# Patient Record
Sex: Male | Born: 1985 | Race: White | Hispanic: No | Marital: Single | Smoking: Never smoker
Health system: Southern US, Community
[De-identification: ages and names within clinical notes are randomized; demographics above are authoritative.]

## PROBLEM LIST (undated history)

## (undated) DIAGNOSIS — F909 Attention-deficit hyperactivity disorder, unspecified type: Secondary | ICD-10-CM

## (undated) HISTORY — DX: Attention-deficit hyperactivity disorder, unspecified type: F90.9

---

## 2007-03-26 ENCOUNTER — Ambulatory Visit (HOSPITAL_COMMUNITY): Admission: RE | Admit: 2007-03-26 | Discharge: 2007-03-26 | Payer: Self-pay | Admitting: Chiropractic Medicine

## 2007-05-03 ENCOUNTER — Ambulatory Visit: Payer: Self-pay | Admitting: Family Medicine

## 2008-04-08 ENCOUNTER — Emergency Department (HOSPITAL_COMMUNITY): Admission: EM | Admit: 2008-04-08 | Discharge: 2008-04-08 | Payer: Self-pay | Admitting: Emergency Medicine

## 2008-04-10 ENCOUNTER — Ambulatory Visit: Payer: Self-pay | Admitting: Family Medicine

## 2008-05-25 ENCOUNTER — Ambulatory Visit: Payer: Self-pay | Admitting: Family Medicine

## 2009-03-25 IMAGING — CR DG KNEE COMPLETE 4+V*R*
4 series · 4 of 4 positions shown · non-contrast
Comparison: 03/26/2007.

Addendum Begins

There is a small cortical avulsion fracture in the anterior aspect
of the of one of the femoral condyles on the lateral view.
Addendum Ends
CLINICAL DATA: Anterior right knee pain and swelling following an
injury.
RIGHT KNEE - COMPLETE 4+ VIEW

[t knee lat right]
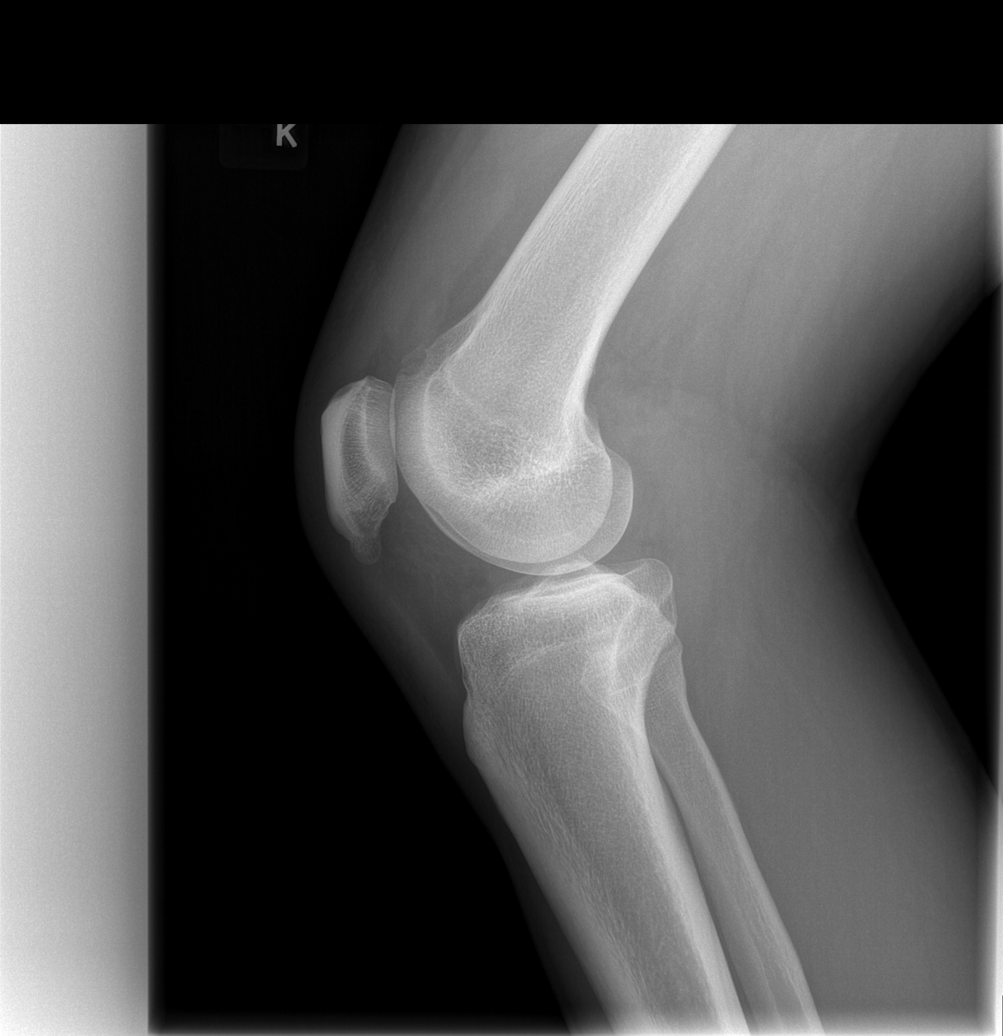

[t knee ap right]
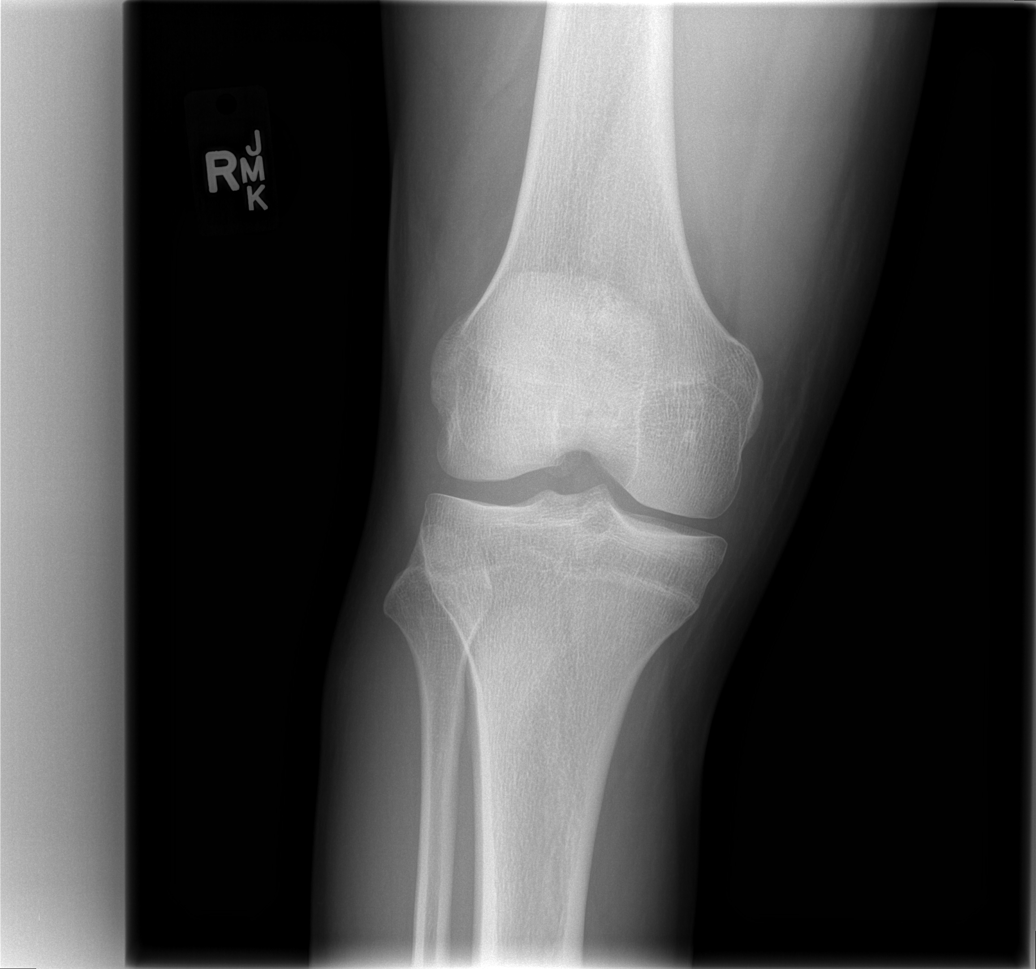

[t knee oblique right (1 of 2)]
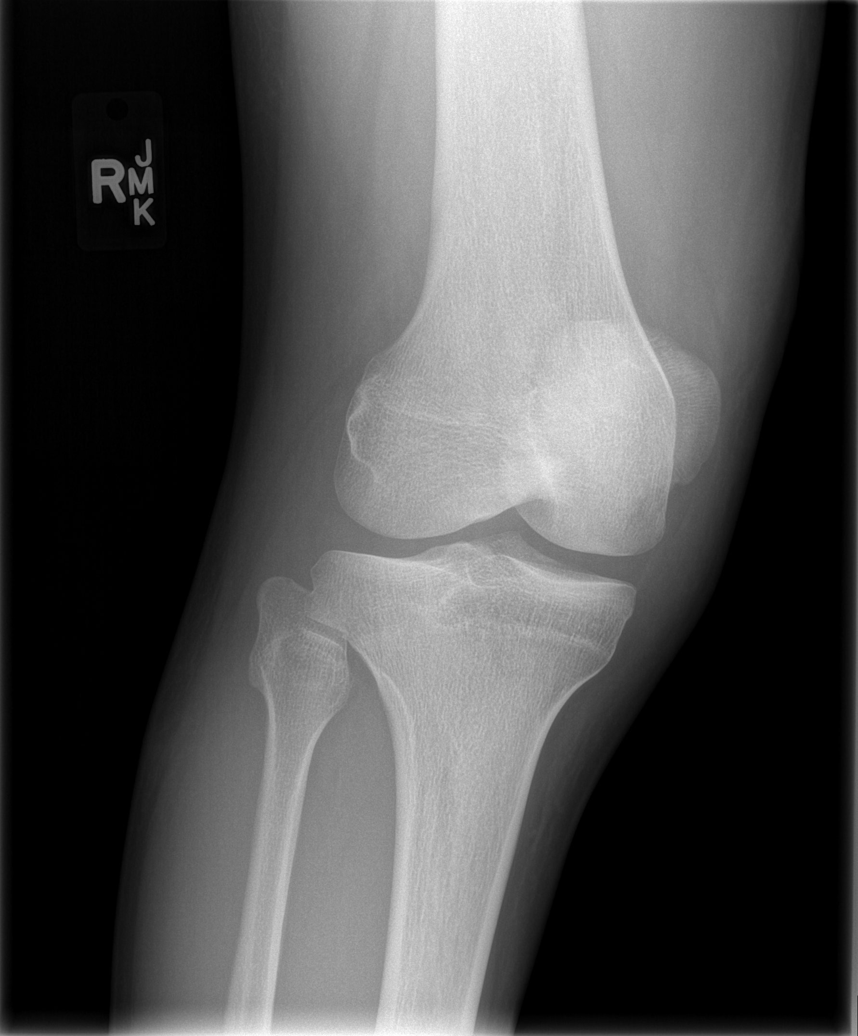

[t knee oblique right (2 of 2)]
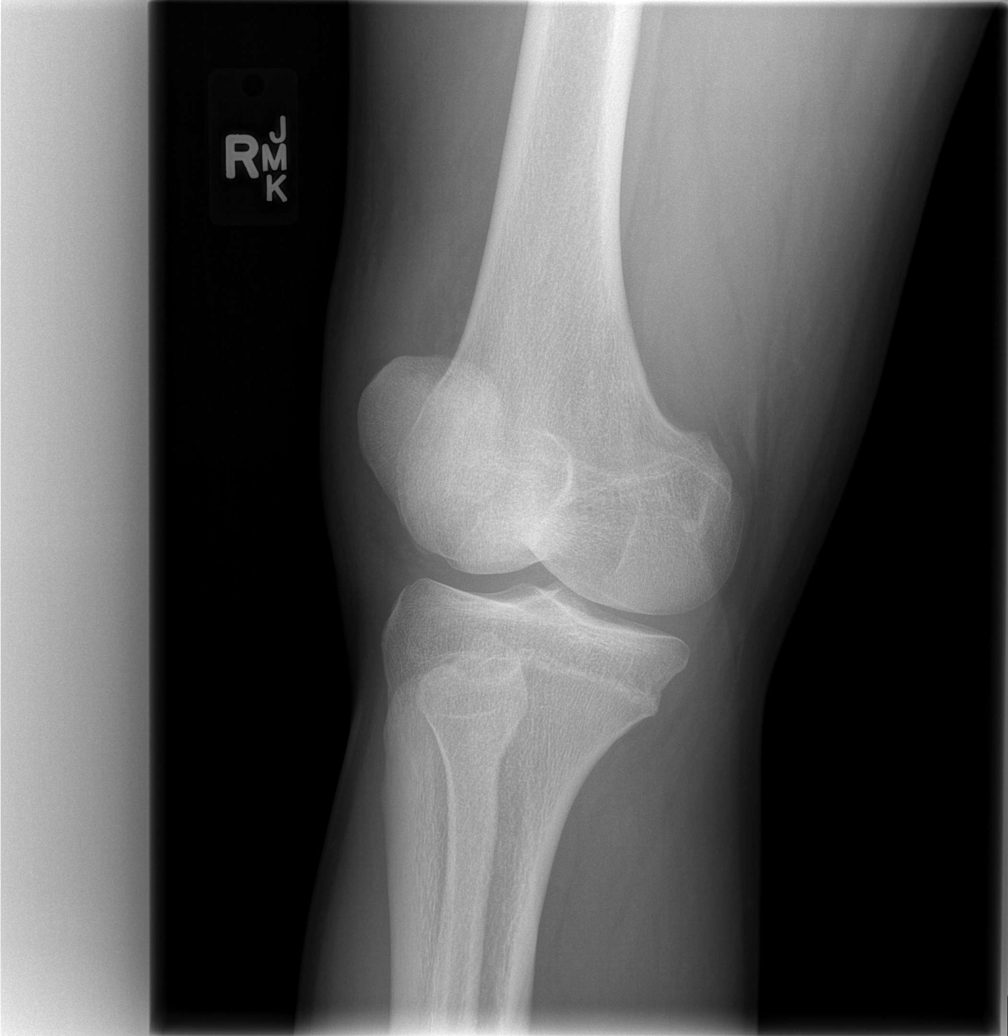

[4 of 4 positions shown; findings below may reference images not displayed]

FINDINGS: Interval small moderate sized effusion.  Stable
elongation of the inferior patella.  No fracture or dislocation
seen.
IMPRESSION: Small to moderate sized effusion.  No fracture.

## 2009-08-15 DEATH — deceased

## 2010-05-17 ENCOUNTER — Ambulatory Visit: Payer: Self-pay | Admitting: Family Medicine

## 2010-05-21 ENCOUNTER — Ambulatory Visit: Payer: Self-pay | Admitting: Family Medicine

## 2011-06-04 ENCOUNTER — Encounter: Payer: Self-pay | Admitting: Family Medicine

## 2011-07-23 ENCOUNTER — Ambulatory Visit (INDEPENDENT_AMBULATORY_CARE_PROVIDER_SITE_OTHER): Payer: BC Managed Care – PPO | Admitting: Family Medicine

## 2011-07-23 ENCOUNTER — Encounter: Payer: Self-pay | Admitting: Family Medicine

## 2011-07-23 VITALS — BP 110/76 | HR 80 | Temp 97.7°F | Wt 186.0 lb

## 2011-07-23 DIAGNOSIS — J209 Acute bronchitis, unspecified: Secondary | ICD-10-CM

## 2011-07-23 MED ORDER — AMOXICILLIN ER 775 MG PO TB24: 775.0000 mg | ORAL_TABLET | Freq: Every day | ORAL | Status: AC

## 2011-07-23 NOTE — Patient Instructions (Addendum)
Take whatever you need for the cough but you might find NyQuil to work really well at night. Call me if you're not totally back to normal at the end of the antibiotics

## 2011-07-23 NOTE — Progress Notes (Signed)
  Subjective:    Patient ID: Arthur Thomas, male    DOB: 20-Mar-1986, 25 y.o.   MRN: [id number]  HPI [name] 2 weeks ago he started with nasal congestion, runny nose, sore throat followed by productive cough several days later no fevers, chills, earache. The sinus congestion is much better however he is still having a productive cough the he does not smoke and does not have spinning and fall allergies   Review of Systems     Objective:   Physical Exam alert and in no distress. Tympanic membranes and canals are normal. Throat is clear. Tonsils are normal. Neck is supple without adenopathy or thyromegaly. Cardiac exam shows a regular sinus rhythm without murmurs or gallops. Lungs are clear to auscultation.        Assessment & Plan:   1. Bronchitis, acute    I will treat with Moxatag. He is to call me if not entirely better.

## 2011-07-24 ENCOUNTER — Ambulatory Visit: Payer: Self-pay | Admitting: Family Medicine

## 2011-11-05 ENCOUNTER — Encounter: Payer: Self-pay | Admitting: Medical

## 2011-11-05 ENCOUNTER — Ambulatory Visit (INDEPENDENT_AMBULATORY_CARE_PROVIDER_SITE_OTHER): Payer: BC Managed Care – PPO | Admitting: Medical

## 2011-11-05 ENCOUNTER — Other Ambulatory Visit: Payer: Self-pay | Admitting: Medical

## 2011-11-05 DIAGNOSIS — L509 Urticaria, unspecified: Secondary | ICD-10-CM

## 2011-11-05 LAB — CBC
Hemoglobin: 15.2 g/dL (ref 13.0–17.0)
RBC: 4.85 MIL/uL (ref 4.22–5.81)
WBC: 4.3 10*3/uL (ref 4.0–10.5)

## 2011-11-05 LAB — COMPREHENSIVE METABOLIC PANEL
Albumin: 4.8 g/dL (ref 3.5–5.2)
BUN: 18 mg/dL (ref 6–23)
Calcium: 9.5 mg/dL (ref 8.4–10.5)
Chloride: 105 mEq/L (ref 96–112)
Glucose, Bld: 85 mg/dL (ref 70–99)
Potassium: 4 mEq/L (ref 3.5–5.3)

## 2011-11-05 MED ORDER — HYDROXYZINE HCL 25 MG PO TABS: 25.0000 mg | ORAL_TABLET | Freq: Four times a day (QID) | ORAL | Status: AC | PRN

## 2011-11-05 NOTE — Patient Instructions (Signed)
Hives Hives (urticaria) are itchy, red, swollen patches on the skin. They may change size, shape, and location quickly and repeatedly. Hives that occur deeper in the skin can cause swelling of the hands, feet, and face. Hives may be an allergic reaction to something you or your child ate, touched, or put on the skin. Hives can also be a reaction to cold, heat, viral infections, medication, insect bites, or emotional stress. Often the cause is hard to find. Hives can come and go for several days to several weeks. Hives are not contagious. HOME CARE INSTRUCTIONS   If the cause of the hives is known, avoid exposure to that source.   To relieve itching and rash:   Apply cold compresses to the skin or take cool water baths. Do not take or give your child hot baths or showers because the warmth will make the itching worse.   The best medicine for hives is an antihistamine. An antihistamine will not cure hives, but it will reduce their severity. You can use an antihistamine available over the counter. This medicine may make your child sleepy. Teenagers should not drive while using this medicine.   Take or give an antihistamine every 6 hours until the hives are completely gone for 24 hours or as directed.   Your child may have other medications prescribed for itching. Give these as directed by your child's caregiver.   You or your child should wear loose fitting clothing, including undergarments. Skin irritations may make hives worse.   Follow-up as directed by your caregiver.  SEEK MEDICAL CARE IF:   You or your child still have considerable itching after taking the medication (prescribed or purchased over the counter).   Joint swelling or pain occurs.  SEEK IMMEDIATE MEDICAL CARE IF:   You have a fever.   Swollen lips or tongue are noticed.   There is difficulty with breathing, swallowing, or tightness in the throat or chest.   Abdominal pain develops.   Your child starts acting very  sick.  These may be the first signs of a life-threatening allergic reaction. THIS IS AN EMERGENCY. Call 911 for medical help. MAKE SURE YOU:   Understand these instructions.   Will watch your condition.   Will get help right away if you are not doing well or get worse.  Document Released: 09/01/2005 Document Revised: 05/14/2011 Document Reviewed: 04/21/2008 [employer] Patient Information 2012 [employer]. 

## 2011-11-05 NOTE — Progress Notes (Signed)
  Subjective:   HPI  Arthur Thomas is a 26 y.o. male who presents rash.  Rash started 6 days ago began as small bumps on arms and feet and has turned into splotches. He has a few spots on his abdomen and back of neck, otherwise no rash on soles or palms, no other rash.  Rash is itchy. He has use over-the-counter hydrocortisone cream with slight improvement. He denies any new contacts, no new foods no new exposures or sick contacts with similar. No prior similar rash.  No other aggravating or relieving factors.    No other c/o.  The following portions of the patient's history were reviewed and updated as appropriate: allergies, current medications, past family history, past medical history, past social history, past surgical history and problem list.  Past Medical History  Diagnosis Date  . ADHD (attention deficit hyperactivity disorder)     No Known Allergies   Review of Systems Gen.: No fever, chills, sweats HEENT: Negative Heart: No chest pain or palpitations Lungs: No shortness of breath or wheezing GI: No pain, nausea vomiting diarrhea GU negative     Objective:   Physical Exam  General appearence: alert, no distress, WD/WN Skin: Multiple pink/red slightly raised wheals scattered on the forearms, feet, back and neck, and a few on his torso consistent with urticaria HEENT: normocephalic, sclerae anicteric, TMs pearly, nares patent, no discharge or erythema, pharynx normal Oral cavity: MMM, no lesions Neck: supple, no lymphadenopathy, no thyromegaly, no masses Heart: RRR, normal S1, S2, no murmurs Lungs: CTA bilaterally, no wheezes, rhonchi, or rales   Assessment and Plan :     Encounter Diagnosis  Name Primary?  Marland Kitchen Urticaria Yes   Cause unclear.  Labs today. Prescription for hydroxyzine. Can continue over-the-counter cortisone cream. We'll call lab results and plan

## 2011-11-07 ENCOUNTER — Other Ambulatory Visit: Payer: Self-pay | Admitting: Medical

## 2011-11-08 LAB — BILIRUBIN, FRACTIONATED(TOT/DIR/INDIR)
Bilirubin, Direct: 0.5 mg/dL — ABNORMAL HIGH (ref 0.0–0.3)
Total Bilirubin: 3.5 mg/dL — ABNORMAL HIGH (ref 0.3–1.2)

## 2011-11-08 LAB — GAMMA GT: GGT: 19 U/L (ref 7–51)

## 2011-11-12 ENCOUNTER — Other Ambulatory Visit: Payer: Self-pay | Admitting: Medical

## 2011-11-18 ENCOUNTER — Ambulatory Visit
Admission: RE | Admit: 2011-11-18 | Discharge: 2011-11-18 | Disposition: A | Discharge status: Home / Self Care | Payer: BC Managed Care – PPO | Source: Ambulatory Visit | Attending: Medical | Admitting: Medical

## 2012-02-10 ENCOUNTER — Telehealth: Payer: Self-pay | Admitting: Internal Medicine

## 2012-02-10 NOTE — Telephone Encounter (Signed)
Nevermind, mom called back and pt is coming in tomorrow to see Arthur Thomas

## 2012-02-10 NOTE — Telephone Encounter (Signed)
pt mom states that he was out of work yesterday and had a stomach bug and had a fever 104.2 and vomiting and he is fine today however, his work requires a doctor note to be out and she wanted to know if you could write him out since he couldnt come in yesterday for it.

## 2012-02-11 ENCOUNTER — Ambulatory Visit (INDEPENDENT_AMBULATORY_CARE_PROVIDER_SITE_OTHER): Payer: BC Managed Care – PPO | Admitting: Medical

## 2012-02-11 ENCOUNTER — Encounter: Payer: Self-pay | Admitting: Medical

## 2012-02-11 VITALS — BP 120/80 | HR 92 | Temp 98.4°F | Resp 16 | Wt 190.0 lb

## 2012-02-11 DIAGNOSIS — B9789 Other viral agents as the cause of diseases classified elsewhere: Secondary | ICD-10-CM

## 2012-02-11 DIAGNOSIS — R509 Fever, unspecified: Secondary | ICD-10-CM

## 2012-02-11 DIAGNOSIS — B349 Viral infection, unspecified: Secondary | ICD-10-CM

## 2012-02-11 DIAGNOSIS — R748 Abnormal levels of other serum enzymes: Secondary | ICD-10-CM

## 2012-02-11 LAB — COMPREHENSIVE METABOLIC PANEL
ALT: 24 U/L (ref 0–53)
AST: 28 U/L (ref 0–37)
Alkaline Phosphatase: 73 U/L (ref 39–117)
Calcium: 9.2 mg/dL (ref 8.4–10.5)
Chloride: 102 mEq/L (ref 96–112)
Creat: 0.82 mg/dL (ref 0.50–1.35)
Potassium: 3.9 mEq/L (ref 3.5–5.3)

## 2012-02-11 MED ORDER — HYDROCODONE-ACETAMINOPHEN 5-500 MG PO TABS: 1.0000 | ORAL_TABLET | Freq: Four times a day (QID) | ORAL | Status: AC | PRN

## 2012-02-12 ENCOUNTER — Encounter: Payer: Self-pay | Admitting: Medical

## 2012-02-12 LAB — CBC WITH DIFFERENTIAL/PLATELET
Basophils Absolute: 0.1 10*3/uL (ref 0.0–0.1)
Basophils Relative: 3 % — ABNORMAL HIGH (ref 0–1)
Eosinophils Relative: 1 % (ref 0–5)
Lymphocytes Relative: 38 % (ref 12–46)
Neutro Abs: 0.7 10*3/uL — ABNORMAL LOW (ref 1.7–7.7)
Platelets: 84 10*3/uL — ABNORMAL LOW (ref 150–400)
RDW: 12.9 % (ref 11.5–15.5)
WBC: 1.9 10*3/uL — ABNORMAL LOW (ref 4.0–10.5)

## 2012-02-12 NOTE — Progress Notes (Signed)
Subjective:   HPI  Arthur Thomas is a 26 y.o. male who presents for illness.  He reports 4 day hx/o fevers up to 104, sweats, feeling achy in chest and back, had nausea and vomiting all day on Monday 2 days ago, but the N/V resolved.  Still feels weak, has a headache that is pretty bad, and stomach rumbling.  Denies diarrhea, abdominal pain, no URI symptoms, no sick contacts, no neck pain or stiffness.  No rash.  Using hydration, Gatorade, and Vit C.  No other aggravating or relieving factors.    No other c/o.  The following portions of the patient's history were reviewed and updated as appropriate: allergies, current medications, past family history, past medical history, past social history, past surgical history and problem list.  Past Medical History  Diagnosis Date  . ADHD (attention deficit hyperactivity disorder)     No Known Allergies  Review of Systems Constitutional: +fever, +chills, +sweats, -unexpected -weight change, +fatigue ENT: -runny nose, -ear pain, -sore throat Cardiology:  -chest pain, -palpitations, -edema Respiratory: -cough, -shortness of breath, -wheezing Gastroenterology: -abdominal pain, +nausea, +vomiting, -diarrhea, -constipation  Musculoskeletal: -arthralgias, +myalgias, -joint swelling, -back pain Urology: -dysuria, -difficulty urinating, -hematuria, -urinary frequency, -urgency Neurology: -weakness, -tingling, -numbness       Objective:   Physical Exam  General appearance: alert, no distress, WD/WN, somewhat fatigued appearing Skin: moist and clammy throughout HEENT: normocephalic, sclerae anicteric, TMs pearly, nares patent, no discharge or erythema, pharynx normal Oral cavity: MMM, no lesions Neck: supple, no lymphadenopathy, no thyromegaly, no masses Heart: RRR, normal S1, S2, no murmurs Lungs: CTA bilaterally, no wheezes, rhonchi, or rales Abdomen: +bs, soft, non tender, non distended, no masses, no hepatomegaly, no splenomegaly Pulses: 2+  symmetric, upper and lower extremities, normal cap refill   Assessment and Plan :     Encounter Diagnoses  Name Primary?  . Fever Yes  . Viral syndrome   . Elevated liver enzymes    Symptoms and exam suggest recent viral gastroenteritis.  Discussed rehydration, avoiding dehydration, BRAT diet, Ibuprofen for fever and malaise, and note given for days missed and through the next 2 days if needed to recuperate from the illness.  Labs today given the symptoms and due to prior elevated LFTs a few months ago.

## 2012-02-13 ENCOUNTER — Other Ambulatory Visit: Payer: Self-pay | Admitting: Family Medicine

## 2012-02-13 DIAGNOSIS — R799 Abnormal finding of blood chemistry, unspecified: Secondary | ICD-10-CM

## 2012-11-03 IMAGING — US US ABDOMEN COMPLETE
1 series · 14 of 25 positions shown · non-contrast
Comparison: None

CLINICAL DATA: Elevated bilirubin.

COMPLETE ABDOMINAL ULTRASOUND

[Series 1: us abdomen complete · 0.24mm/px · 14 of 81 slices shown]
[im 1/81]
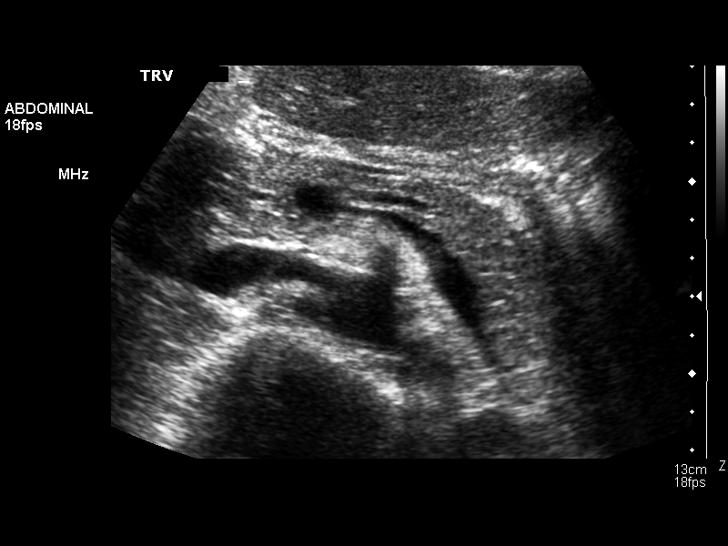
[im 7/81]
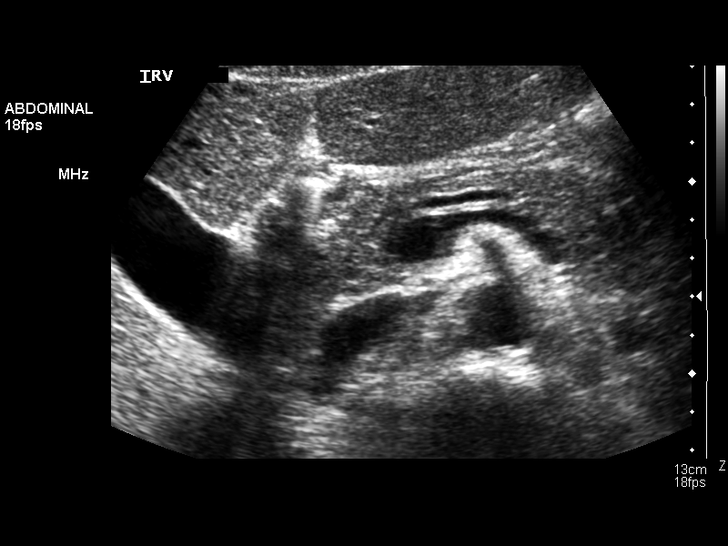
[im 14/81]
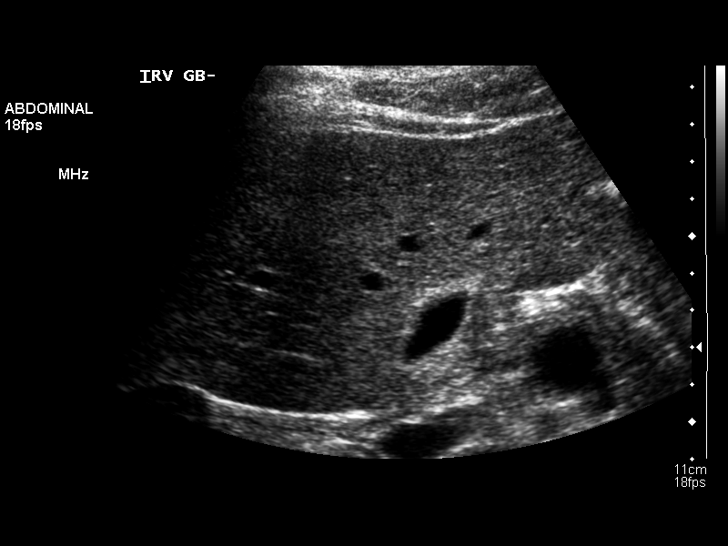
[im 21/81]
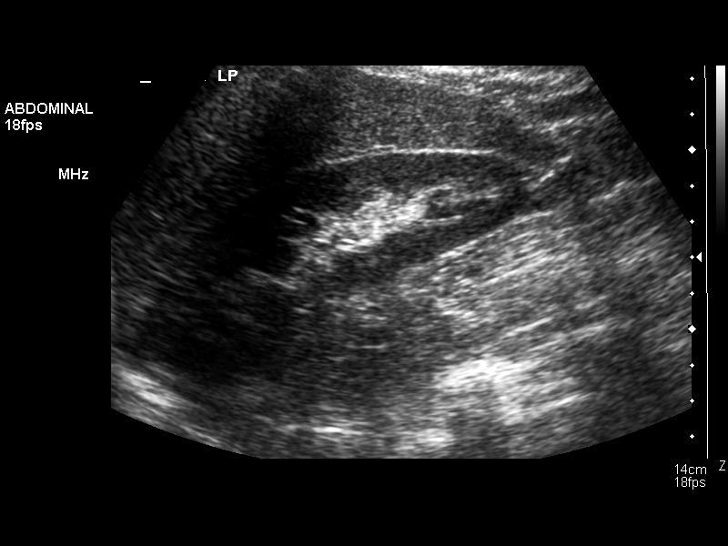
[im 27/81]
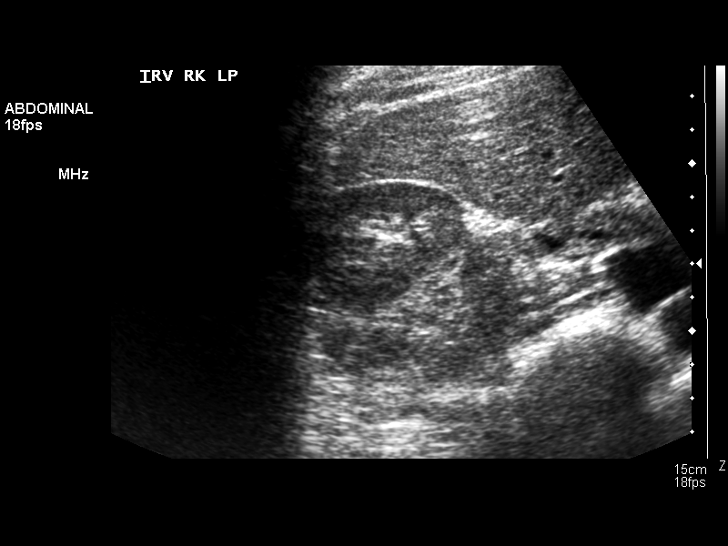
[im 31/81]
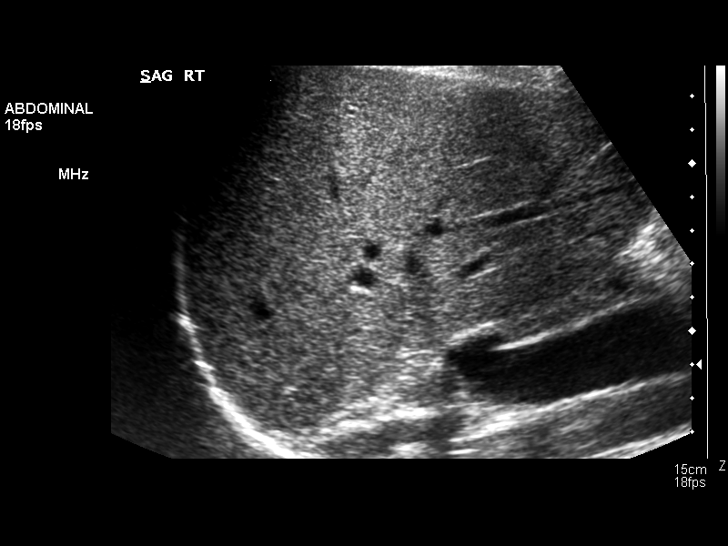
[im 37/81]
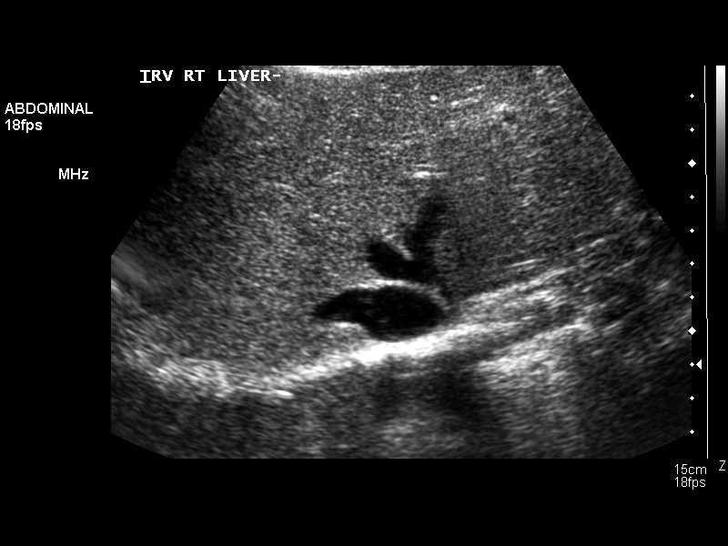
[im 44/81]
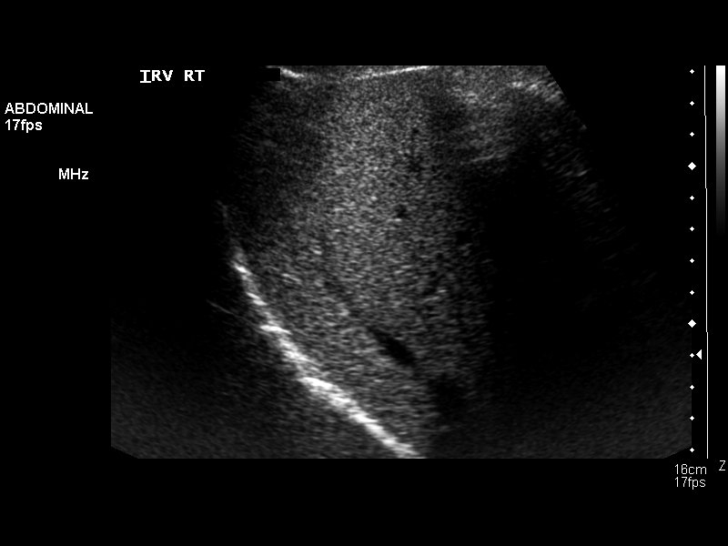
[im 51/81]
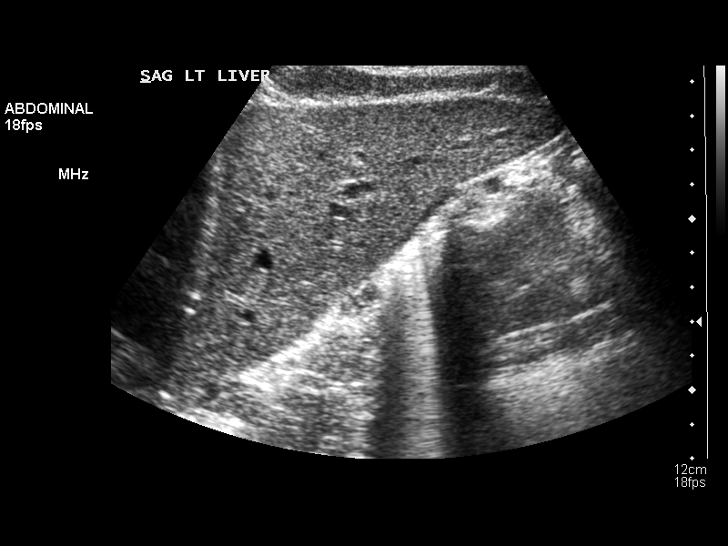
[im 54/81]
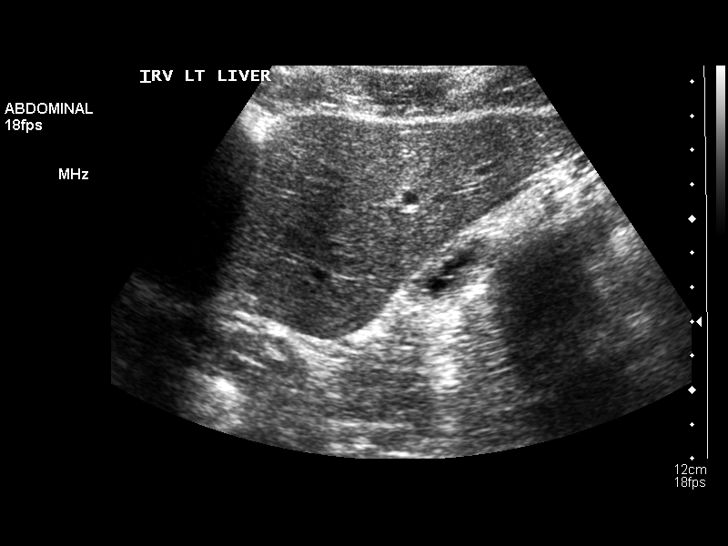
[im 61/81]
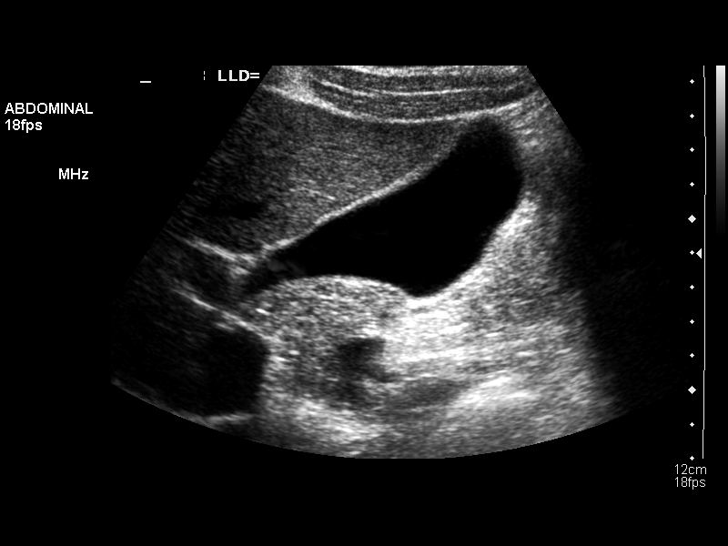
[im 67/81]
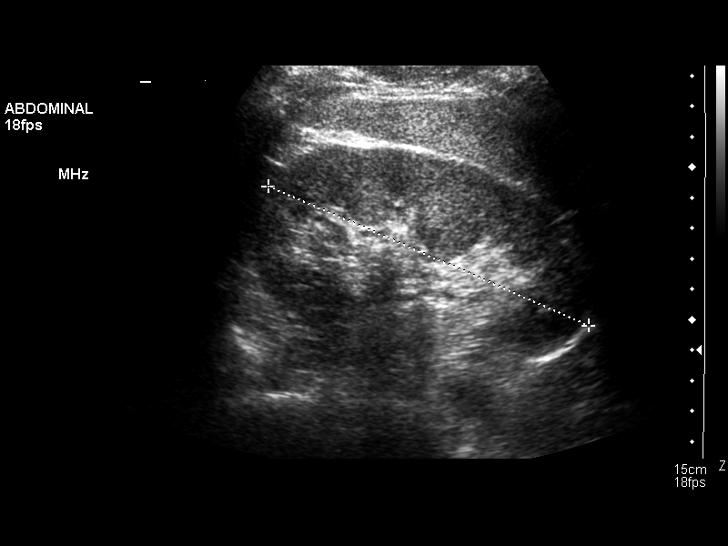
[im 74/81]
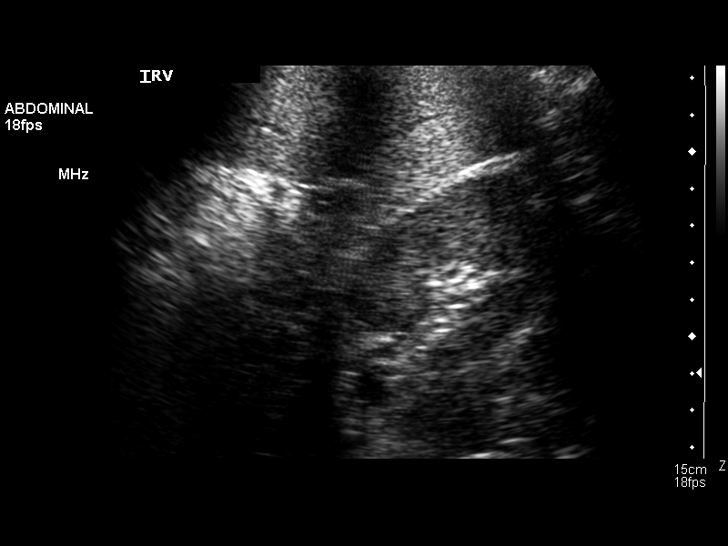
[im 81/81]
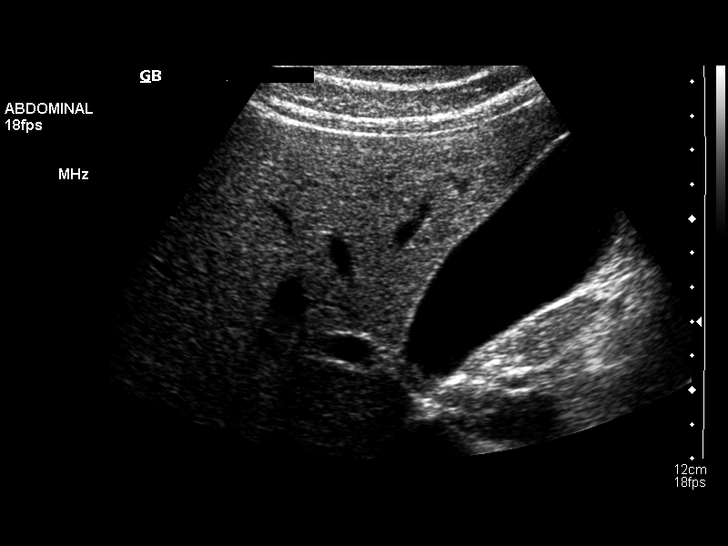

[14 of 25 positions shown; findings below may reference images not displayed]

FINDINGS: Gallbladder:  No gallstones, gallbladder wall thickening, or
pericholecystic fluid.

Common bile duct:  Normal in caliber measuring a maximum of 4.8mm.

Liver:  The liver is sonographically unremarkable.  There is normal
echogenicity without focal lesions or intrahepatic biliary
dilatation.

IVC:  Normal caliber.

Pancreas:  Sonographically unremarkable.  No mass or inflammatory
changes.  The main pancreatic duct is within normal limits in
caliber measuring approximately 2 mm.

Spleen:  Normal size and echogenicity without focal lesions.
Splenic volume estimated at 300 ml.

Right Kidney:  11.9 cm in length. Normal renal cortical thickness
and echogenicity without focal lesions or hydronephrosis.

Left Kidney:  11.5 cm in length. Normal renal cortical thickness
and echogenicity without focal lesions or hydronephrosis.

Abdominal aorta:  Normal caliber.
IMPRESSION: Unremarkable abdominal ultrasound examination.

## 2012-11-12 ENCOUNTER — Telehealth: Payer: Self-pay | Admitting: Family Medicine

## 2012-11-12 NOTE — Telephone Encounter (Signed)
Message copied by Janeice Robinson on Fri Nov 12, 2012 11:35 AM ------      Message from: Jac Canavan      Created: Thu Nov 11, 2012  9:30 PM       Needs f/u appt, labs ------

## 2012-11-12 NOTE — Telephone Encounter (Signed)
I left a message for the patient that he needs to make a follow up appointment with lab work. CLS

## 2012-11-17 ENCOUNTER — Telehealth: Payer: Self-pay | Admitting: Family Medicine

## 2012-11-17 NOTE — Telephone Encounter (Signed)
Message copied by Janeice Robinson on Wed Nov 17, 2012 10:31 AM ------      Message from: Aleen Campi, DAVID S      Created: Wed Nov 17, 2012  4:13 AM       Due for f/u on elevated liver tests, and if no CPX in last 12 mo, consider making it a CPX appt ------

## 2012-11-17 NOTE — Telephone Encounter (Signed)
I left a message with the patients wife about scheduling a follow visit. CLS

## 2013-02-15 ENCOUNTER — Telehealth: Payer: Self-pay | Admitting: Internal Medicine

## 2013-02-15 NOTE — Telephone Encounter (Signed)
Pt wants his medical records so he can get in the [employer]. i have records ready and he will come by this afternoon to pick up records and sign forms

## 2013-02-18 NOTE — Progress Notes (Signed)
I left the patient a voicemail about scheduling his next physical. CLS

## 2014-02-13 ENCOUNTER — Ambulatory Visit: Payer: Self-pay | Admitting: Family Medicine

## 2014-02-15 ENCOUNTER — Encounter: Payer: Self-pay | Admitting: Family Medicine

## 2014-03-14 ENCOUNTER — Telehealth: Payer: Self-pay | Admitting: Internal Medicine

## 2014-03-14 ENCOUNTER — Telehealth: Payer: Self-pay

## 2014-03-14 NOTE — Telephone Encounter (Signed)
Pt informed he would need appointment and has made one for 03/27/14

## 2014-03-14 NOTE — Telephone Encounter (Signed)
Called pt and told him his medical records were ready for him to pick up for the [employer]

## 2014-03-14 NOTE — Telephone Encounter (Signed)
Pt states that he would like you to right him a statement for the [employer] saying that he no longer needs his ADD meds that he has been off of them for a while. Please advise

## 2014-03-14 NOTE — Telephone Encounter (Signed)
Tell him I would be happy to do this but he would need him to come in so we can document more appropriately what's going on

## 2014-03-27 ENCOUNTER — Ambulatory Visit (INDEPENDENT_AMBULATORY_CARE_PROVIDER_SITE_OTHER): Payer: Self-pay | Admitting: Family Medicine

## 2014-03-27 ENCOUNTER — Encounter: Payer: Self-pay | Admitting: Family Medicine

## 2014-03-27 VITALS — Ht 71.5 in | Wt 210.0 lb

## 2014-03-27 DIAGNOSIS — F901 Attention-deficit hyperactivity disorder, predominantly hyperactive type: Secondary | ICD-10-CM

## 2014-03-27 DIAGNOSIS — F909 Attention-deficit hyperactivity disorder, unspecified type: Secondary | ICD-10-CM

## 2014-03-27 NOTE — Progress Notes (Signed)
   Subjective:    Patient ID: Arthur Thomas, male    DOB: 08-29-1986, 28 y.o.   MRN: [id number]  HPI Is here for consultation concerning ADD. He was diagnosed and treated while he was in grade school but did not take it in the middle or high school. He has not had any medications since the late 90s. He made it through high school and is out working and has had no difficulty. He is getting ready to join the [employer] and needs a note stating ADD is not a a factor.   Review of Systems     Objective:   Physical Exam Alert and in no distress otherwise not examined       Assessment & Plan:  Attention-deficit hyperactivity disorder, predominantly hyperactive type  I will write a letter playing that although he has ADD and certainly has not interfered with his ability to function.

## 2014-04-03 ENCOUNTER — Encounter: Payer: Self-pay | Admitting: Family Medicine
